# Patient Record
Sex: Female | Born: 1965 | Race: Black or African American | Hispanic: No | Marital: Married | Smoking: Never smoker
Health system: Southern US, Community
[De-identification: ages and names within clinical notes are randomized; demographics above are authoritative.]

## PROBLEM LIST (undated history)

## (undated) DIAGNOSIS — R112 Nausea with vomiting, unspecified: Secondary | ICD-10-CM

## (undated) DIAGNOSIS — Z9889 Other specified postprocedural states: Secondary | ICD-10-CM

## (undated) DIAGNOSIS — T4145XA Adverse effect of unspecified anesthetic, initial encounter: Secondary | ICD-10-CM

## (undated) DIAGNOSIS — T8859XA Other complications of anesthesia, initial encounter: Secondary | ICD-10-CM

## (undated) DIAGNOSIS — D649 Anemia, unspecified: Secondary | ICD-10-CM

## (undated) HISTORY — PX: ABDOMINAL HYSTERECTOMY: SHX81

## (undated) HISTORY — PX: CHOLECYSTECTOMY: SHX55

---

## 2000-01-18 ENCOUNTER — Ambulatory Visit (HOSPITAL_COMMUNITY): Admission: RE | Admit: 2000-01-18 | Discharge: 2000-01-18 | Payer: Self-pay | Admitting: Family Medicine

## 2000-01-18 ENCOUNTER — Encounter: Payer: Self-pay | Admitting: Family Medicine

## 2000-09-03 ENCOUNTER — Encounter: Payer: Self-pay | Admitting: Family Medicine

## 2000-09-03 ENCOUNTER — Encounter: Admission: RE | Admit: 2000-09-03 | Discharge: 2000-09-03 | Payer: Self-pay | Admitting: Family Medicine

## 2001-05-21 ENCOUNTER — Encounter (INDEPENDENT_AMBULATORY_CARE_PROVIDER_SITE_OTHER): Payer: Self-pay | Admitting: *Deleted

## 2001-05-22 ENCOUNTER — Encounter: Payer: Self-pay | Admitting: Emergency Medicine

## 2001-05-22 ENCOUNTER — Inpatient Hospital Stay (HOSPITAL_COMMUNITY): Admission: EM | Admit: 2001-05-22 | Discharge: 2001-05-26 | Payer: Self-pay | Admitting: Emergency Medicine

## 2001-05-25 ENCOUNTER — Encounter: Payer: Self-pay | Admitting: Surgery

## 2002-09-20 ENCOUNTER — Encounter: Payer: Self-pay | Admitting: Obstetrics and Gynecology

## 2002-09-20 ENCOUNTER — Ambulatory Visit (HOSPITAL_COMMUNITY): Admission: RE | Admit: 2002-09-20 | Discharge: 2002-09-20 | Payer: Self-pay | Admitting: Obstetrics and Gynecology

## 2002-09-21 ENCOUNTER — Encounter (INDEPENDENT_AMBULATORY_CARE_PROVIDER_SITE_OTHER): Payer: Self-pay | Admitting: Specialist

## 2002-09-21 ENCOUNTER — Ambulatory Visit (HOSPITAL_COMMUNITY): Admission: RE | Admit: 2002-09-21 | Discharge: 2002-09-21 | Payer: Self-pay | Admitting: Obstetrics and Gynecology

## 2002-12-09 ENCOUNTER — Emergency Department (HOSPITAL_COMMUNITY): Admission: AD | Admit: 2002-12-09 | Discharge: 2002-12-10 | Payer: Self-pay

## 2002-12-10 ENCOUNTER — Encounter: Payer: Self-pay | Admitting: Emergency Medicine

## 2003-01-20 ENCOUNTER — Other Ambulatory Visit: Admission: RE | Admit: 2003-01-20 | Discharge: 2003-01-20 | Payer: Self-pay | Admitting: Pediatrics

## 2003-01-20 ENCOUNTER — Other Ambulatory Visit: Admission: RE | Admit: 2003-01-20 | Discharge: 2003-01-20 | Payer: Self-pay | Admitting: Obstetrics and Gynecology

## 2003-08-28 ENCOUNTER — Inpatient Hospital Stay (HOSPITAL_COMMUNITY): Admission: AD | Admit: 2003-08-28 | Discharge: 2003-08-30 | Payer: Self-pay | Admitting: Obstetrics & Gynecology

## 2004-03-26 ENCOUNTER — Other Ambulatory Visit: Admission: RE | Admit: 2004-03-26 | Discharge: 2004-03-26 | Payer: Self-pay | Admitting: Obstetrics and Gynecology

## 2005-06-03 ENCOUNTER — Other Ambulatory Visit: Admission: RE | Admit: 2005-06-03 | Discharge: 2005-06-03 | Payer: Self-pay | Admitting: Obstetrics and Gynecology

## 2005-07-08 ENCOUNTER — Ambulatory Visit (HOSPITAL_COMMUNITY): Admission: RE | Admit: 2005-07-08 | Discharge: 2005-07-08 | Payer: Self-pay | Admitting: Obstetrics and Gynecology

## 2005-07-08 ENCOUNTER — Encounter (INDEPENDENT_AMBULATORY_CARE_PROVIDER_SITE_OTHER): Payer: Self-pay | Admitting: Specialist

## 2006-04-21 ENCOUNTER — Inpatient Hospital Stay: Admission: AD | Admit: 2006-04-21 | Discharge: 2006-04-21 | Payer: Self-pay | Admitting: Obstetrics and Gynecology

## 2010-02-06 ENCOUNTER — Encounter: Admission: RE | Admit: 2010-02-06 | Discharge: 2010-02-06 | Payer: Self-pay | Admitting: Obstetrics and Gynecology

## 2010-02-21 ENCOUNTER — Ambulatory Visit (HOSPITAL_COMMUNITY): Admission: RE | Admit: 2010-02-21 | Discharge: 2010-02-21 | Payer: Self-pay | Admitting: Obstetrics and Gynecology

## 2010-05-12 ENCOUNTER — Encounter: Payer: Self-pay | Admitting: Obstetrics and Gynecology

## 2010-07-03 LAB — CBC
Hemoglobin: 12 g/dL (ref 12.0–15.0)
MCHC: 32.6 g/dL (ref 30.0–36.0)
RDW: 12.4 % (ref 11.5–15.5)
WBC: 6.6 10*3/uL (ref 4.0–10.5)

## 2010-07-03 LAB — PREGNANCY, URINE: Preg Test, Ur: NEGATIVE

## 2010-09-07 NOTE — Consult Note (Signed)
[place]  Patient:    Breanna Gates, Breanna Gates Visit Number: [id number] MRN: [id number]          Service Type: MED Location: 1800 1823 01 Attending Physician:  Nelia Shi Dictated by:   Sandria Bales. Ezzard Standing, M.D. Proc. Date: 05/22/01 Admit Date:  05/21/2001   CC:         Miguel Aschoff, M.D.             Anastasio Auerbach, M.D.                          Consultation Report  DATE OF BIRTH: 22-Mar-1966  REASON FOR CONSULTATION: Pancreatitis.  HISTORY OF PRESENT ILLNESS: Breanna Gates is a 45 year old black female, who is a patient of Dr. Miguel Aschoff of [facility], who has known cholelithiasis.  She had the gallstones diagnosed approximately a year ago, January 2002, when she was having some vague abdominal pain, particularly precipitated by eating red meat, and underwent an ultrasound.  Interestingly, we actually operated on her mother this past, I think, June 2002 for cholelithiasis.  Anyway, Breanna Gates was anxious to have surgery. She ate some spicy [place] food on Wednesday evening, May 20, 2001, and had increasing abdominal pain, and presented the evening of May 21, 2001 to the [facility] Emergency Room.  Evaluation revealed an elevated amylase to 5644, elevated lipase to 6653, and her diagnosis of acute pancreatitis probably secondary to cholelithiasis was made.  The patient denies any history of peptic ulcer disease, liver disease, prior pancreatic disease, or colon disease.  She has had no prior abdominal surgery.  ALLERGIES: None.  MEDICATIONS: Her only medication is birth control pills.  REVIEW OF SYSTEMS: PULMONARY: Does not smoke cigarettes.  No history of pneumonia, tuberculosis.  CARDIAC: No history of chest pain, heart attack, hypertension.  GASTROINTESTINAL: See History of Present Illness.  UROLOGIC: No history of kidney stones or kidney infections.  GYN: She has never been pregnant.   Her last period was about four weeks ago.  SOCIAL HISTORY: She works for [employer].  She is accompanied by her mother in the emergency room.  PHYSICAL EXAMINATION:  VITAL SIGNS: Temperature 98.8 degrees, blood pressure 145/74, pulse 95, respirations 18.  GENERAL: She is a well-nourished, pleasant black female.  She is alert and cooperative on physical examination.  HEENT: Unremarkable.  NECK: Supple without mass, without thyromegaly.  LUNGS: Clear to auscultation, with equal respirations.  HEART: Regular rate and rhythm.  ABDOMEN: Decreased bowel sounds, but she does have a few bowel sounds.  She has some mild epigastric tenderness which is not lateralized very well.  She has no hernia, no abdominal mass.  PELVIC: I did not do a pelvic examination on her.  EXTREMITIES: She has good strength in all four extremities.  LABORATORY DATA: Besides the amylase and lipase already recorded in my history, her WBC was 6600, her hemoglobin 12, hematocrit 37.  Urine pregnancy was negative.  Liver function tests are pending at the time of this dictation.  IMPRESSION:  1. Acute pancreatitis with elevated lipase and amylase.  CT scan only     suggests mild pancreatitis with questionable common bile duct dilatation.     I agree with current plan of intravenous hydration, keeping her NPO,     following her amylase and lipase and liver functions on a daily basis.  2. Cholelithiasis.  She has had a CT  scan.  They raised the question of an     emphysematous gallbladder, but her clinical picture really does not match     as she is not febrile, she has a normal WBC, she does not have     localizing right upper quadrant tenderness.  I actually think they are     reading possibly hypoechoic hypodense gallstones, which may be floating     in bile against the gallbladder wall.  RECOMMENDATIONS: I think she will need eventual cholecystectomy when her pancreatitis has cleared, either during this  hospitalization or soon after discharge.  I explained this to her and her mother. Dictated by:   Sandria Bales. Ezzard Standing, M.D. Attending Physician:  Nelia Shi DD:  05/22/01 TD:  05/22/01 Job: [id number]

## 2010-09-07 NOTE — Op Note (Signed)
Breanna Gates, Breanna Gates NO.:  [id number]   MEDICAL RECORD NO.:  [id number]          PATIENT TYPE:  AMB   LOCATION:  SDC                           FACILITY:  [facility]   PHYSICIAN:  Carrington Clamp, M.D. DATE OF BIRTH:  1965/10/27   DATE OF PROCEDURE:  07/08/2005  DATE OF DISCHARGE:                                 OPERATIVE REPORT   PREOPERATIVE DIAGNOSIS:  Missed abortion.   POSTOPERATIVE DIAGNOSIS:  Missed abortion.   PROCEDURE:  Dilation and evacuation.   SURGEON:  Carrington Clamp, M.D.   ASSISTANT:  None.   ANESTHESIA:  General.   SPECIMENS:  Uterine contents.   ESTIMATED BLOOD LOSS:  150 mL.   IV FLUIDS:  500 mL.   URINE OUTPUT:  Not measured.   COMPLICATIONS:  None.   FINDINGS:  Nine weeks' size uterus down to seven weeks size post procedure.  Good cry.   MEDICATIONS:  None.   COUNTS:  Correct x3.   MEDICATIONS:  Methergine 0.2 mg IM.   TECHNIQUE:  After adequate LMA anesthesia was achieved, the patient was  prepped and draped in the usual sterile fashion in the dorsal lithotomy  position.  The bladder was emptied with a red rubber catheter and a speculum  placed in the vagina.  A single-tooth tenaculum was used to stabilize the  cervix and the cervix was dilated up with Hegar dilators.  The 9 mm curette  was passed into the uterus and suction curettage was performed several  times.  Alternating suction curettage and sharp curettage were performed until a  good cry was noted.  All instruments were then withdrawn from the vagina and  the patient was given a dose of Methergine, returned to recovery room in  stable condition.      Carrington Clamp, M.D.  Electronically Signed     MH/MEDQ  D:  07/08/2005  T:  07/09/2005  Job:  [id number]

## 2010-09-07 NOTE — Op Note (Signed)
[place]. [facility]  Patient:    Breanna Gates, Breanna Gates Visit Number: [id number] MRN: [id number]          Service Type: MED Location: 5000 5020 01 Attending Physician:  Lonia Blood Dictated by:   Sandria Bales. Ezzard Standing, M.D. Proc. Date: 05/25/01 Admit Date:  05/21/2001 Discharge Date: 05/26/2001   CC:         Dr. Allen Derry, M.D.                           Operative Report  DATE OF BIRTH:  [birth year].  PREOPERATIVE DIAGNOSIS:  Chronic cholecystitis, cholelithiasis, possible common bile duct stone, with gallstone pancreatitis.  POSTOPERATIVE DIAGNOSIS:  Chronic cholecystitis, cholelithiasis, and gallstone pancreatitis, with normal intraoperative cholangiogram.  PROCEDURE:  Laparoscopic cholecystectomy with intraoperative cholangiogram.  SURGEON:  Sandria Bales. Ezzard Standing, M.D.  FIRST ASSISTANT:  Maisie Fus B. Samuella Cota, M.D.  ANESTHESIA:  General endotracheal.  ESTIMATED BLOOD LOSS:  Minimal.  INDICATION FOR PROCEDURE:  Breanna Gates is a 45 year old black female who presented to [facility] on January 31 with acute pancreatitis secondary to known cholelithiasis.  The patient now has had almost complete resolution of her liver function tests, comes for cholecystectomy.  DESCRIPTION OF PROCEDURE:  The patient was already on Unasyn as an antibiotic. She had an orogastric tube, PAS stockings in place.  Her abdomen was prepped with Betadine solution and sterilely draped.  An infraumbilical was made with sharp dissection and carried down to the abdominal cavity.  A 0 degree 10 mm laparoscope was inserted through an infraumbilical incision, and this was inserted through a 12 mm Hasson trocar. The Hasson trocar was inserted and secured with a 0 Vicryl suture.  Abdominal exploration revealed the right and left lobes of the liver were unremarkable.  There was some edema along the falciform ligament consistent with a probable  resolving pancreatitis.  Her stomach was unremarkable.  Her colon was mildly distended.  The remainder of the exam, including her bowel and her pelvic area, all was unremarkable from what could be seen.  Three additional trocars were placed, a 10 mm Ethicon trocar in the subxiphoid location and a 5 mm Ethicon trocar in the right midsubcostal location and a 5 mm Ethicon trocar in the right lateral subcostal location.  First I had to take down some adhesions which overlay the gallbladder, and these really looked like she had had some chronic cholecystitis.  I got to the gallbladder, which had a thickened wall, again consistent with chronic cholecystitis, and dissected down to the gallbladder-cystic duct junction.  I then placed a clip on the gallbladder side of the cystic duct, shot an intraoperative cholangiogram.  Intraoperative cholangiogram was done using a cut-off Taut catheter inserted through a 14-gauge Jelco.  The Taut catheter was inserted to the side of the cystic duct and intraoperative cholangiogram obtained.  Intraoperative cholangiogram using half-strength Hypaque solution injected under direct fluoroscopy showed free flow of contrast down the cystic duct to the common bile duct into the duodenum.  It rapidly emptied.  There was no filling defect, no evidence of mass, and this was felt to be a normal intraoperative cholangiogram.  The Taut catheter was then removed, the cystic duct triply endoclipped.  There was a branch of the cystic artery which was identified, and this was doubly endoclipped.  The gallbladder was then sharply and  bluntly dissected from the gallbladder bed.  I did avulse the lining of the liver at the to remove the wall of the gallbladder.  This was because the gallbladder was so thickened, it was attached this way, and this caused some bleeding which took me a little while to get under control with Bovie electrocautery.  However, after  irrigating cautery, there was no bleeding, no bile leak.  I did put some Surgicel in the gallbladder bed.  I then removed the gallbladder, placed it in an Endocatch bag, and delivered it through the umbilicus and sent it to pathology.  The abdomen was irrigated with approximately 2 L of saline.  Each trocar was removed under direct visualization.  There was no bleeding at any trocar site. The umbilical trocar was closed with a 0 Vicryl suture, and the skin of each site was closed with a 5-0 Vicryl suture, painted with tincture of benzoin and Steri-Strips, and sterilely dressed.  The patient tolerated the procedure well, was transported to the recovery room in good condition.  The sponge and needle count were correct at the end of the case. Dictated by:   Sandria Bales. Ezzard Standing, M.D. Attending Physician:  Jetty Duhamel T DD:  05/25/01 TD:  05/25/01 Job: [id number]

## 2010-09-07 NOTE — Op Note (Signed)
   NAMEKEIERA, STRATHMAN NO.:  [id number]   MEDICAL RECORD NO.:  [id number]                   PATIENT TYPE:  AMB   LOCATION:  SDC                                  FACILITY:  [facility]   PHYSICIAN:  Malva Limes, M.D.                 DATE OF BIRTH:  [birth year]   DATE OF PROCEDURE:  DATE OF DISCHARGE:                                 OPERATIVE REPORT   PREOPERATIVE DIAGNOSES:  Missed abortion.   POSTOPERATIVE DIAGNOSES:  Missed abortion.   PROCEDURE:  Dilation and curettage.   SURGEON:  Malva Limes, M.D.   ANESTHESIA:  MAC with paracervical block.   ANTIBIOTICS:  1. Ancef 1 g.  2. Cleocin 900 mg IV x1.   ESTIMATED BLOOD LOSS:  30 mL.   COMPLICATIONS:  None.   SPECIMENS:  Products of conception sent to pathology.   DRAINS:  None.   PROCEDURE:  The patient was taken to the operating room where she was placed  in a dorsal lithotomy position.  She was prepped with Hibiclens and draped  in the usual fashion for this procedure.  A sterile speculum was placed in  the vagina.  20 mL of 1% lidocaine was used for paracervical block.  A  single tooth tenaculum was applied to the anterior lip of the cervix.  The  cervical os was then serially dilated to a 31 [place].  An 8 mm suction  cannula was placed into the uterine cavity and products of conception were  withdrawn.  Sharp curettage was then performed followed by repeat suction.  The patient tolerated the procedure well.  She will be discharged to home.  She will be sent home with Keflex 500 mg q.i.d. for two days and Darvocet to  take p.r.n.  The patient's blood type is Rh positive and therefore no RhoGAM  is indicated.  She will follow up in the office in four weeks.                                               Malva Limes, M.D.    MA/MEDQ  D:  09/21/2002  T:  09/21/2002  Job:  [id number]

## 2011-02-15 ENCOUNTER — Other Ambulatory Visit (HOSPITAL_COMMUNITY): Payer: Self-pay | Admitting: Obstetrics and Gynecology

## 2011-02-15 DIAGNOSIS — N971 Female infertility of tubal origin: Secondary | ICD-10-CM

## 2011-03-04 ENCOUNTER — Ambulatory Visit (HOSPITAL_COMMUNITY): Payer: Self-pay

## 2011-03-05 ENCOUNTER — Ambulatory Visit (HOSPITAL_COMMUNITY)
Admission: RE | Admit: 2011-03-05 | Discharge: 2011-03-05 | Disposition: A | Discharge status: Home / Self Care | Payer: BC Managed Care – PPO | Source: Ambulatory Visit | Attending: Obstetrics and Gynecology | Admitting: Obstetrics and Gynecology

## 2011-03-05 DIAGNOSIS — N971 Female infertility of tubal origin: Secondary | ICD-10-CM

## 2011-03-05 DIAGNOSIS — Z3049 Encounter for surveillance of other contraceptives: Secondary | ICD-10-CM | POA: Insufficient documentation

## 2011-03-05 MED ORDER — IOHEXOL 300 MG/ML  SOLN: 3.0000 mL | Freq: Once | INTRAMUSCULAR | Status: AC | PRN

## 2014-06-09 ENCOUNTER — Other Ambulatory Visit (HOSPITAL_COMMUNITY): Payer: Self-pay | Admitting: Obstetrics and Gynecology

## 2014-06-09 ENCOUNTER — Other Ambulatory Visit: Payer: Self-pay | Admitting: Obstetrics and Gynecology

## 2014-06-09 DIAGNOSIS — M954 Acquired deformity of chest and rib: Secondary | ICD-10-CM

## 2014-06-10 LAB — CYTOLOGY - PAP

## 2017-03-01 ENCOUNTER — Emergency Department (HOSPITAL_BASED_OUTPATIENT_CLINIC_OR_DEPARTMENT_OTHER): Payer: BLUE CROSS/BLUE SHIELD

## 2017-03-01 ENCOUNTER — Other Ambulatory Visit: Payer: Self-pay

## 2017-03-01 ENCOUNTER — Emergency Department (HOSPITAL_BASED_OUTPATIENT_CLINIC_OR_DEPARTMENT_OTHER)
Admission: EM | Admit: 2017-03-01 | Discharge: 2017-03-02 | Disposition: A | Discharge status: Home / Self Care | Payer: BLUE CROSS/BLUE SHIELD | Attending: Emergency Medicine | Admitting: Emergency Medicine

## 2017-03-01 ENCOUNTER — Encounter (HOSPITAL_BASED_OUTPATIENT_CLINIC_OR_DEPARTMENT_OTHER): Payer: Self-pay | Admitting: Emergency Medicine

## 2017-03-01 DIAGNOSIS — R1031 Right lower quadrant pain: Secondary | ICD-10-CM | POA: Insufficient documentation

## 2017-03-01 DIAGNOSIS — Z9049 Acquired absence of other specified parts of digestive tract: Secondary | ICD-10-CM | POA: Diagnosis not present

## 2017-03-01 LAB — CBC WITH DIFFERENTIAL/PLATELET
BASOS ABS: 0 10*3/uL (ref 0.0–0.1)
Basophils Relative: 0 %
EOS PCT: 1 %
Eosinophils Absolute: 0.1 10*3/uL (ref 0.0–0.7)
HEMATOCRIT: 39.6 % (ref 36.0–46.0)
Hemoglobin: 13 g/dL (ref 12.0–15.0)
LYMPHS PCT: 40 %
Lymphs Abs: 2.6 10*3/uL (ref 0.7–4.0)
MCH: 28.8 pg (ref 26.0–34.0)
MCHC: 32.8 g/dL (ref 30.0–36.0)
MCV: 87.6 fL (ref 78.0–100.0)
MONO ABS: 0.6 10*3/uL (ref 0.1–1.0)
MONOS PCT: 9 %
NEUTROS ABS: 3.2 10*3/uL (ref 1.7–7.7)
Neutrophils Relative %: 50 %
PLATELETS: 291 10*3/uL (ref 150–400)
RBC: 4.52 MIL/uL (ref 3.87–5.11)
RDW: 13.2 % (ref 11.5–15.5)
WBC: 6.4 10*3/uL (ref 4.0–10.5)

## 2017-03-01 LAB — COMPREHENSIVE METABOLIC PANEL
ALBUMIN: 4.4 g/dL (ref 3.5–5.0)
ALK PHOS: 79 U/L (ref 38–126)
ALT: 12 U/L — ABNORMAL LOW (ref 14–54)
ANION GAP: 6 (ref 5–15)
AST: 37 U/L (ref 15–41)
BILIRUBIN TOTAL: 0.7 mg/dL (ref 0.3–1.2)
BUN: 12 mg/dL (ref 6–20)
CO2: 28 mmol/L (ref 22–32)
Calcium: 9.1 mg/dL (ref 8.9–10.3)
Chloride: 104 mmol/L (ref 101–111)
Creatinine, Ser: 0.63 mg/dL (ref 0.44–1.00)
GFR calc Af Amer: >60 mL/min (ref >60)
GFR calc non Af Amer: >60 mL/min (ref >60)
GLUCOSE: 103 mg/dL — AB (ref 65–99)
POTASSIUM: 3.5 mmol/L (ref 3.5–5.1)
Sodium: 138 mmol/L (ref 135–145)
TOTAL PROTEIN: 7.2 g/dL (ref 6.5–8.1)

## 2017-03-01 LAB — URINALYSIS, MICROSCOPIC (REFLEX)

## 2017-03-01 LAB — URINALYSIS, ROUTINE W REFLEX MICROSCOPIC
Bilirubin Urine: NEGATIVE
GLUCOSE, UA: NEGATIVE mg/dL
Ketones, ur: 15 mg/dL — AB
LEUKOCYTES UA: NEGATIVE
Nitrite: NEGATIVE
PH: 6 (ref 5.0–8.0)
PROTEIN: NEGATIVE mg/dL
Specific Gravity, Urine: >1.03 — ABNORMAL HIGH (ref 1.005–1.030)

## 2017-03-01 LAB — PREGNANCY, URINE: PREG TEST UR: NEGATIVE

## 2017-03-01 LAB — LIPASE, BLOOD: Lipase: 27 U/L (ref 11–51)

## 2017-03-01 MED ORDER — SODIUM CHLORIDE 0.9 % IV BOLUS (SEPSIS)
1000.0000 mL | Freq: Once | INTRAVENOUS | Status: AC
  Administered 2017-03-01: 1000 mL via INTRAVENOUS

## 2017-03-01 MED ORDER — IOPAMIDOL (ISOVUE-300) INJECTION 61%
100.0000 mL | Freq: Once | INTRAVENOUS | Status: AC | PRN
  Administered 2017-03-01: 100 mL via INTRAVENOUS

## 2017-03-01 MED ORDER — MORPHINE SULFATE (PF) 4 MG/ML IV SOLN
4.0000 mg | Freq: Once | INTRAVENOUS | Status: AC
  Administered 2017-03-01: 4 mg via INTRAVENOUS
  Filled 2017-03-01: qty 1

## 2017-03-01 NOTE — ED Provider Notes (Signed)
[facility] DEPARTMENT Provider Note   CSN: [id number] Arrival date & time: 03/01/17  2142     History   Chief Complaint Chief Complaint  Patient presents with  . Abdominal Pain    HPI Breanna Gates is a 51 y.o. female.  51yo F w/ PMH includingcholecystectomy who presents with abdominal pain.  Last night, she had a gradual onset of right lower quadrant abdominal pain that has been sharp, intermittent, and persistent since it began.  She denies any radiation of the pain to her back or anywhere else in her abdomen.  She has had no associated nausea, vomiting, diarrhea, fevers, urinary symptoms, or recent illness.  She has taken Aleve for the pain that she states takes several hours before it kicks in and eventually wears off and the pain returns.  She has never had pain like this before.   The history is provided by the patient.  Abdominal Pain      History reviewed. No pertinent past medical history.  There are no active problems to display for this patient.   Past Surgical History:  Procedure Laterality Date  . CHOLECYSTECTOMY      OB History    No data available       Home Medications    Prior to Admission medications   Not on File    Family History No family history on file.  Social History Social History   Tobacco Use  . Smoking status: Never Smoker  . Smokeless tobacco: Never Used  Substance Use Topics  . Alcohol use: No    Frequency: Never  . Drug use: No     Allergies   Patient has no known allergies.   Review of Systems Review of Systems  Gastrointestinal: Positive for abdominal pain.   All other systems reviewed and are negative except that which was mentioned in HPI   Physical Exam Updated Vital Signs BP (!) 141/95 (BP Location: Left Arm)   Pulse 85   Temp 98.6 F (37 C) (Oral)   Resp (!) 21   LMP 01/20/2017 (Approximate)   SpO2 100%   Physical Exam  Constitutional: She is oriented to person, place,  and time. She appears well-developed and well-nourished.  uncomfortable  HENT:  Head: Normocephalic and atraumatic.  Mouth/Throat: Oropharynx is clear and moist.  Moist mucous membranes  Eyes: Conjunctivae are normal. Pupils are equal, round, and reactive to light.  Neck: Neck supple.  Cardiovascular: Normal rate, regular rhythm and normal heart sounds.  No murmur heard. Pulmonary/Chest: Effort normal and breath sounds normal.  Abdominal: Soft. Bowel sounds are normal. She exhibits no distension. There is tenderness in the right lower quadrant. There is no rigidity, no rebound and no guarding.  Musculoskeletal: She exhibits no edema.  Neurological: She is alert and oriented to person, place, and time.  Fluent speech  Skin: Skin is warm and dry.  Psychiatric: She has a normal mood and affect. Judgment normal.  Nursing note and vitals reviewed.    ED Treatments / Results  Labs (all labs ordered are listed, but only abnormal results are displayed) Labs Reviewed  COMPREHENSIVE METABOLIC PANEL - Abnormal; Notable for the following components:      Result Value   Glucose, Bld 103 (*)    ALT 12 (*)    All other components within normal limits  CBC WITH DIFFERENTIAL/PLATELET  LIPASE, BLOOD  PREGNANCY, URINE  URINALYSIS, ROUTINE W REFLEX MICROSCOPIC    EKG  EKG Interpretation None  Radiology No results found.  Procedures Procedures (including critical care time)  Medications Ordered in ED Medications  morphine 4 MG/ML injection 4 mg (4 mg Intravenous Given 03/01/17 2215)  sodium chloride 0.9 % bolus 1,000 mL (1,000 mLs Intravenous New Bag/Given 03/01/17 2219)     Initial Impression / Assessment and Plan / ED Course  I have reviewed the triage vital signs and the nursing notes.  Pertinent labs & imaging results that were available during my care of the patient were reviewed by me and considered in my medical decision making (see chart for details).    RLQ pain  x 1 day, non-toxic on exam w/ reassuring VS. Tenderness in RLQ, no peritonitis. Gave morphine and IVF bolus. DDx includes appendicitis, kidney stone, ovarian cyst, ovarian torsion, ectopic pregnancy.   Labs show unremarkable CMP and CBC.  Urine pregnancy test and urinalysis are pending.  Will order CT after urine studies are completed.  I have signed out to the oncoming provider pending urine studies and CT abd/pelvis.  Final Clinical Impressions(s) / ED Diagnoses   Final diagnoses:  None    ED Discharge Orders    None       Kalyn Dimattia, Wenda Overland, MD 03/01/17 2324

## 2017-03-01 NOTE — ED Triage Notes (Signed)
PT presents with c/o RLQ pain that started last night.

## 2017-03-01 NOTE — ED Provider Notes (Signed)
Nursing notes and vitals signs, including pulse oximetry, reviewed.  Summary of this visit's results, reviewed by myself:  EKG:  EKG Interpretation  Date/Time:    Ventricular Rate:    PR Interval:    QRS Duration:   QT Interval:    QTC Calculation:   R Axis:     Text Interpretation:         Labs:  Results for orders placed or performed during the hospital encounter of 03/01/17 (from the past 24 hour(s))  Comprehensive metabolic panel     Status: Abnormal   Collection Time: 03/01/17 10:11 PM  Result Value Ref Range   Sodium 138 135 - 145 mmol/L   Potassium 3.5 3.5 - 5.1 mmol/L   Chloride 104 101 - 111 mmol/L   CO2 28 22 - 32 mmol/L   Glucose, Bld 103 (H) 65 - 99 mg/dL   BUN 12 6 - 20 mg/dL   Creatinine, Ser 0.63 0.44 - 1.00 mg/dL   Calcium 9.1 8.9 - 10.3 mg/dL   Total Protein 7.2 6.5 - 8.1 g/dL   Albumin 4.4 3.5 - 5.0 g/dL   AST 37 15 - 41 U/L   ALT 12 (L) 14 - 54 U/L   Alkaline Phosphatase 79 38 - 126 U/L   Total Bilirubin 0.7 0.3 - 1.2 mg/dL   GFR calc non Af Amer >60 >60 mL/min   GFR calc Af Amer >60 >60 mL/min   Anion gap 6 5 - 15  CBC with Differential     Status: None   Collection Time: 03/01/17 10:11 PM  Result Value Ref Range   WBC 6.4 4.0 - 10.5 K/uL   RBC 4.52 3.87 - 5.11 MIL/uL   Hemoglobin 13.0 12.0 - 15.0 g/dL   HCT 39.6 36.0 - 46.0 %   MCV 87.6 78.0 - 100.0 fL   MCH 28.8 26.0 - 34.0 pg   MCHC 32.8 30.0 - 36.0 g/dL   RDW 13.2 11.5 - 15.5 %   Platelets 291 150 - 400 K/uL   Neutrophils Relative % 50 %   Neutro Abs 3.2 1.7 - 7.7 K/uL   Lymphocytes Relative 40 %   Lymphs Abs 2.6 0.7 - 4.0 K/uL   Monocytes Relative 9 %   Monocytes Absolute 0.6 0.1 - 1.0 K/uL   Eosinophils Relative 1 %   Eosinophils Absolute 0.1 0.0 - 0.7 K/uL   Basophils Relative 0 %   Basophils Absolute 0.0 0.0 - 0.1 K/uL  Lipase, blood     Status: None   Collection Time: 03/01/17 10:11 PM  Result Value Ref Range   Lipase 27 11 - 51 U/L  Pregnancy, urine     Status: None    Collection Time: 03/01/17 11:10 PM  Result Value Ref Range   Preg Test, Ur NEGATIVE NEGATIVE  Urinalysis, Routine w reflex microscopic     Status: Abnormal   Collection Time: 03/01/17 11:10 PM  Result Value Ref Range   Color, Urine YELLOW YELLOW   APPearance CLEAR CLEAR   Specific Gravity, Urine >1.030 (H) 1.005 - 1.030   pH 6.0 5.0 - 8.0   Glucose, UA NEGATIVE NEGATIVE mg/dL   Hgb urine dipstick TRACE (A) NEGATIVE   Bilirubin Urine NEGATIVE NEGATIVE   Ketones, ur 15 (A) NEGATIVE mg/dL   Protein, ur NEGATIVE NEGATIVE mg/dL   Nitrite NEGATIVE NEGATIVE   Leukocytes, UA NEGATIVE NEGATIVE  Urinalysis, Microscopic (reflex)     Status: Abnormal   Collection Time: 03/01/17 11:10 PM  Result  Value Ref Range   RBC / HPF 0-5 0 - 5 RBC/hpf   WBC, UA 0-5 0 - 5 WBC/hpf   Bacteria, UA FEW (A) NONE SEEN   Squamous Epithelial / LPF 0-5 (A) NONE SEEN   Mucus PRESENT     Imaging Studies: Ct Abdomen Pelvis W Contrast  Result Date: 03/02/2017 CLINICAL DATA:  Acute onset of right lower quadrant abdominal pain. EXAM: CT ABDOMEN AND PELVIS WITH CONTRAST TECHNIQUE: Multidetector CT imaging of the abdomen and pelvis was performed using the standard protocol following bolus administration of intravenous contrast. CONTRAST:  19mL ISOVUE-300 IOPAMIDOL (ISOVUE-300) INJECTION 61% COMPARISON:  None. FINDINGS: Lower chest: The visualized lung bases are grossly clear. The visualized portions of the mediastinum are unremarkable. Hepatobiliary: A nonspecific 1.9 cm hypodensity is noted at the right hepatic lobe. The liver is otherwise unremarkable. The patient is status post cholecystectomy, with clips noted at the gallbladder fossa. Mild prominence of the intrahepatic biliary ducts is likely within normal limits status post cholecystectomy. Pancreas: The pancreas is within normal limits. Spleen: The spleen is unremarkable in appearance. Adrenals/Urinary Tract: The adrenal glands are unremarkable in appearance. The  kidneys are within normal limits. There is no evidence of hydronephrosis. No renal or ureteral stones are identified. No perinephric stranding is seen. Stomach/Bowel: The stomach is unremarkable in appearance. The small bowel is within normal limits. The appendix is normal in caliber, without evidence of appendicitis. The colon is unremarkable in appearance. Vascular/Lymphatic: The abdominal aorta is unremarkable in appearance. The inferior vena cava is grossly unremarkable. No retroperitoneal lymphadenopathy is seen. No pelvic sidewall lymphadenopathy is identified. Reproductive: Apparent loculated fluid is noted within the endometrial echo complex. This is of uncertain significance. Pelvic ultrasound would be helpful for further evaluation. The ovaries are relatively symmetric. No suspicious adnexal masses are seen. Other: No additional soft tissue abnormalities are seen. Musculoskeletal: No acute osseous abnormalities are identified. The visualized musculature is unremarkable in appearance. IMPRESSION: 1. Apparent loculated fluid within the endometrial echo complex, of uncertain significance. Pelvic ultrasound would be helpful for further evaluation, to exclude underlying mass. 2. Nonspecific 1.9 cm hypodensity at the right hepatic lobe. Electronically Signed   By: Garald Balding M.D.   On: 03/02/2017 00:10   12:18 AM Patient advised of CT findings.  We will have her return later today for pelvic ultrasound.   Verona Hartshorn, Jenny Reichmann, MD 03/02/17 [phone]

## 2017-03-02 ENCOUNTER — Encounter (HOSPITAL_BASED_OUTPATIENT_CLINIC_OR_DEPARTMENT_OTHER): Payer: Self-pay | Admitting: Radiology

## 2017-03-02 ENCOUNTER — Ambulatory Visit (HOSPITAL_BASED_OUTPATIENT_CLINIC_OR_DEPARTMENT_OTHER)
Admission: RE | Admit: 2017-03-02 | Discharge: 2017-03-02 | Disposition: A | Discharge status: Home / Self Care | Payer: BLUE CROSS/BLUE SHIELD | Source: Ambulatory Visit | Attending: Emergency Medicine | Admitting: Emergency Medicine

## 2017-03-02 DIAGNOSIS — N898 Other specified noninflammatory disorders of vagina: Secondary | ICD-10-CM | POA: Insufficient documentation

## 2017-03-02 DIAGNOSIS — R1031 Right lower quadrant pain: Secondary | ICD-10-CM

## 2017-03-02 NOTE — ED Notes (Signed)
Pt given d/c instructions as per chart. Verbalizes understanding. No questions. 

## 2017-06-02 ENCOUNTER — Other Ambulatory Visit: Payer: Self-pay | Admitting: Obstetrics and Gynecology

## 2017-06-02 DIAGNOSIS — R928 Other abnormal and inconclusive findings on diagnostic imaging of breast: Secondary | ICD-10-CM

## 2017-06-04 ENCOUNTER — Ambulatory Visit
Admission: RE | Admit: 2017-06-04 | Discharge: 2017-06-04 | Disposition: A | Discharge status: Home / Self Care | Payer: Managed Care, Other (non HMO) | Source: Ambulatory Visit | Attending: Obstetrics and Gynecology | Admitting: Obstetrics and Gynecology

## 2017-06-04 ENCOUNTER — Ambulatory Visit: Payer: BLUE CROSS/BLUE SHIELD

## 2017-06-04 DIAGNOSIS — R928 Other abnormal and inconclusive findings on diagnostic imaging of breast: Secondary | ICD-10-CM

## 2017-12-31 NOTE — Progress Notes (Signed)
Please place orders in epic! Pt. Has a preop

## 2017-12-31 NOTE — Patient Instructions (Signed)
DEREON WILLIAMSEN  12/31/2017   Your procedure is scheduled on: 01-14-18  Report to [facility] Main  Entrance  Report to admitting at     [place] AM    Call this number if you have problems the morning of surgery [phone]   Remember: Do not eat food or drink liquids :After Midnight.     Take these medicines the morning of surgery with A SIP OF WATER: none                                You may not have any metal on your body including hair pins and              piercings  Do not wear jewelry, make-up, lotions, powders or perfumes, deodorant             Do not wear nail polish.  Do not shave  48 hours prior to surgery.     Do not bring valuables to the hospital. [place].  Contacts, dentures or bridgework may not be worn into surgery.  Leave suitcase in the car. After surgery it may be brought to your room.                Please read over the following fact sheets you were given: _____________________________________________________________________           [facility] - Preparing for Surgery Before surgery, you can play an important role.  Because skin is not sterile, your skin needs to be as free of germs as possible.  You can reduce the number of germs on your skin by washing with CHG (chlorahexidine gluconate) soap before surgery.  CHG is an antiseptic cleaner which kills germs and bonds with the skin to continue killing germs even after washing. Please DO NOT use if you have an allergy to CHG or antibacterial soaps.  If your skin becomes reddened/irritated stop using the CHG and inform your nurse when you arrive at Short Stay. Do not shave (including legs and underarms) for at least 48 hours prior to the first CHG shower.  You may shave your face/neck. Please follow these instructions carefully:  1.  Shower with CHG Soap the night before surgery and the  morning of Surgery.  2.  If you choose to wash  your hair, wash your hair first as usual with your  normal  shampoo.  3.  After you shampoo, rinse your hair and body thoroughly to remove the  shampoo.                           4.  Use CHG as you would any other liquid soap.  You can apply chg directly  to the skin and wash                       Gently with a scrungie or clean washcloth.  5.  Apply the CHG Soap to your body ONLY FROM THE NECK DOWN.   Do not use on face/ open  Wound or open sores. Avoid contact with eyes, ears mouth and genitals (private parts).                       Wash face,  Genitals (private parts) with your normal soap.             6.  Wash thoroughly, paying special attention to the area where your surgery  will be performed.  7.  Thoroughly rinse your body with warm water from the neck down.  8.  DO NOT shower/wash with your normal soap after using and rinsing off  the CHG Soap.                9.  Pat yourself dry with a clean towel.            10.  Wear clean pajamas.            11.  Place clean sheets on your bed the night of your first shower and do not  sleep with pets. Day of Surgery : Do not apply any lotions/deodorants the morning of surgery.  Please wear clean clothes to the hospital/surgery center.  FAILURE TO FOLLOW THESE INSTRUCTIONS MAY RESULT IN THE CANCELLATION OF YOUR SURGERY PATIENT SIGNATURE_________________________________  NURSE SIGNATURE__________________________________  ________________________________________________________________________

## 2018-01-06 ENCOUNTER — Other Ambulatory Visit: Payer: Self-pay | Admitting: Obstetrics and Gynecology

## 2018-01-06 ENCOUNTER — Encounter (HOSPITAL_COMMUNITY): Payer: Self-pay

## 2018-01-06 ENCOUNTER — Encounter (HOSPITAL_COMMUNITY)
Admission: RE | Admit: 2018-01-06 | Discharge: 2018-01-06 | Disposition: A | Discharge status: Home / Self Care | Payer: Managed Care, Other (non HMO) | Source: Ambulatory Visit | Attending: Obstetrics and Gynecology | Admitting: Obstetrics and Gynecology

## 2018-01-06 ENCOUNTER — Other Ambulatory Visit: Payer: Self-pay

## 2018-01-06 DIAGNOSIS — Z01812 Encounter for preprocedural laboratory examination: Secondary | ICD-10-CM | POA: Insufficient documentation

## 2018-01-06 HISTORY — DX: Anemia, unspecified: D64.9

## 2018-01-06 HISTORY — DX: Nausea with vomiting, unspecified: R11.2

## 2018-01-06 HISTORY — DX: Adverse effect of unspecified anesthetic, initial encounter: T41.45XA

## 2018-01-06 HISTORY — DX: Other specified postprocedural states: Z98.890

## 2018-01-06 HISTORY — DX: Other complications of anesthesia, initial encounter: T88.59XA

## 2018-01-06 LAB — BASIC METABOLIC PANEL
ANION GAP: 6 (ref 5–15)
BUN: 13 mg/dL (ref 6–20)
CO2: 32 mmol/L (ref 22–32)
Calcium: 9.6 mg/dL (ref 8.9–10.3)
Chloride: 105 mmol/L (ref 98–111)
Creatinine, Ser: 0.76 mg/dL (ref 0.44–1.00)
Glucose, Bld: 94 mg/dL (ref 70–99)
POTASSIUM: 4.4 mmol/L (ref 3.5–5.1)
SODIUM: 143 mmol/L (ref 135–145)

## 2018-01-06 LAB — HCG, SERUM, QUALITATIVE: Preg, Serum: NEGATIVE

## 2018-01-06 LAB — CBC
HEMATOCRIT: 41.3 % (ref 36.0–46.0)
HEMOGLOBIN: 13.3 g/dL (ref 12.0–15.0)
MCH: 28.5 pg (ref 26.0–34.0)
MCHC: 32.2 g/dL (ref 30.0–36.0)
MCV: 88.6 fL (ref 78.0–100.0)
Platelets: 284 10*3/uL (ref 150–400)
RBC: 4.66 MIL/uL (ref 3.87–5.11)
RDW: 13.3 % (ref 11.5–15.5)
WBC: 3.2 10*3/uL — ABNORMAL LOW (ref 4.0–10.5)

## 2018-01-13 ENCOUNTER — Other Ambulatory Visit: Payer: Self-pay | Admitting: Obstetrics and Gynecology

## 2018-01-13 NOTE — H&P (Signed)
52 y.o. G4P1 with persistent menorrhagia and adenomyosis, s/p novasure and adiana for this and failed.  Pt also has a lot of cramping with periods and likely has endometriosis. Pt desires definitive.   Past Medical History:  Diagnosis Date  . Anemia    borderline  . Complication of anesthesia   . PONV (postoperative nausea and vomiting)    Past Surgical History:  Procedure Laterality Date  . ABDOMINAL HYSTERECTOMY     Dr. Philis Pique 01-14-18   robotic  . CHOLECYSTECTOMY      Social History   Socioeconomic History  . Marital status: Married    Spouse name: Not on file  . Number of children: Not on file  . Years of education: Not on file  . Highest education level: Not on file  Occupational History  . Not on file  Social Needs  . Financial resource strain: Not on file  . Food insecurity:    Worry: Not on file    Inability: Not on file  . Transportation needs:    Medical: Not on file    Non-medical: Not on file  Tobacco Use  . Smoking status: Never Smoker  . Smokeless tobacco: Never Used  Substance and Sexual Activity  . Alcohol use: No    Frequency: Never  . Drug use: No  . Sexual activity: Yes  Lifestyle  . Physical activity:    Days per week: Not on file    Minutes per session: Not on file  . Stress: Not on file  Relationships  . Social connections:    Talks on phone: Not on file    Gets together: Not on file    Attends religious service: Not on file    Active member of club or organization: Not on file    Attends meetings of clubs or organizations: Not on file    Relationship status: Not on file  . Intimate partner violence:    Fear of current or ex partner: Not on file    Emotionally abused: Not on file    Physically abused: Not on file    Forced sexual activity: Not on file  Other Topics Concern  . Not on file  Social History Narrative  . Not on file    No current facility-administered medications on file prior to encounter.    Current Outpatient  Medications on File Prior to Encounter  Medication Sig Dispense Refill  . naproxen sodium (ALEVE) 220 MG tablet Take 220 mg by mouth daily as needed (pain).      Allergies  Allergen Reactions  . Latex     Internally causes irritation   . Other Nausea And Vomiting    general anesthesia    There were no vitals filed for this visit.  Lungs: clear to ascultation Cor:  RRR Abdomen:  soft, nontender, nondistended. Ex:  no cords, erythema Pelvic:  Vulva: no masses, no atrophy, no lesions  Vagina: no tenderness, no erythema, no abnormal vaginal discharge, no vesicle(s) or ulcers, no cystocele, no rectocele  Cervix: grossly normal, no discharge, no cervical motion tenderness, sample taken for a Pap smear  Uterus: normal size (7), normal shape, midline, no uterine prolapse, non-tender  Bladder/Urethra: normal meatus, no urethral discharge, no urethral mass, bladder non distended, Urethra well supported  Adnexa/Parametria: no parametrial tenderness, no parametrial mass, no adnexal tenderness, no ovarian mass   transvaginal- 9x6x5; cystic adenomyosis; normal ovaries   A:  Pt with cystic adenomyosis, failed novasure, pain with periods and menorrhagia.  Pt did ask about doing vaginal hysterectomy vs robotic TLH and I explained that I feel robotic is the better route for the following reasons: 1. If she has endometriosis, I will be able to excise or cauterize areas if in areas that are safe to do so. 2.  She had an Adiana intrauterine tubal- I do not know if adhesions are likely from the cautery done to the tubes. 3.  I highly recommend removing her tubes to reduce likelihood of ovarian cancer in future and I could not guarantee total removal via vaginal. 4.  She does not have a lot of decensus and vaginal may be more difficult.    Pt agrees to proceed as planned.   P: P: All risks, benefits and alternatives d/w patient and she desires to proceed.  Patient has undergone a modified diet, ERAS  protocol and will receive preop antibiotics and SCDs during the operation.   Pt to have extended recovery but will go home same day if eating, ambulating, voiding and pain control is good.  Quinterius Gaida A

## 2018-01-14 ENCOUNTER — Ambulatory Visit (HOSPITAL_COMMUNITY): Payer: Managed Care, Other (non HMO) | Admitting: Anesthesiology

## 2018-01-14 ENCOUNTER — Encounter (HOSPITAL_COMMUNITY)
Admission: RE | Disposition: A | Discharge status: Home / Self Care | Payer: Self-pay | Source: Other Acute Inpatient Hospital | Attending: Obstetrics and Gynecology

## 2018-01-14 ENCOUNTER — Encounter (HOSPITAL_COMMUNITY): Payer: Self-pay

## 2018-01-14 ENCOUNTER — Ambulatory Visit (HOSPITAL_COMMUNITY)
Admission: RE | Admit: 2018-01-14 | Discharge: 2018-01-14 | Disposition: A | Discharge status: Home / Self Care | Payer: Managed Care, Other (non HMO) | Source: Other Acute Inpatient Hospital | Attending: Obstetrics and Gynecology | Admitting: Obstetrics and Gynecology

## 2018-01-14 DIAGNOSIS — D259 Leiomyoma of uterus, unspecified: Secondary | ICD-10-CM | POA: Insufficient documentation

## 2018-01-14 DIAGNOSIS — N800 Endometriosis of uterus: Secondary | ICD-10-CM | POA: Diagnosis not present

## 2018-01-14 DIAGNOSIS — N838 Other noninflammatory disorders of ovary, fallopian tube and broad ligament: Secondary | ICD-10-CM | POA: Diagnosis not present

## 2018-01-14 DIAGNOSIS — N938 Other specified abnormal uterine and vaginal bleeding: Secondary | ICD-10-CM | POA: Insufficient documentation

## 2018-01-14 DIAGNOSIS — N920 Excessive and frequent menstruation with regular cycle: Secondary | ICD-10-CM | POA: Insufficient documentation

## 2018-01-14 DIAGNOSIS — Z9049 Acquired absence of other specified parts of digestive tract: Secondary | ICD-10-CM | POA: Insufficient documentation

## 2018-01-14 DIAGNOSIS — Z9889 Other specified postprocedural states: Secondary | ICD-10-CM

## 2018-01-14 HISTORY — PX: CYSTOSCOPY: SHX5120

## 2018-01-14 HISTORY — PX: ROBOTIC ASSISTED BILATERAL SALPINGO OOPHERECTOMY: SHX6078

## 2018-01-14 HISTORY — PX: ROBOTIC ASSISTED LAPAROSCOPIC HYSTERECTOMY AND SALPINGECTOMY: SHX6379

## 2018-01-14 SURGERY — XI ROBOTIC ASSISTED LAPAROSCOPIC HYSTERECTOMY AND SALPINGECTOMY
Anesthesia: General | Laterality: Bilateral

## 2018-01-14 MED ORDER — ACETAMINOPHEN 500 MG PO TABS
1000.0000 mg | ORAL_TABLET | ORAL | Status: AC
  Administered 2018-01-14: 1000 mg via ORAL
  Filled 2018-01-14: qty 2

## 2018-01-14 MED ORDER — CEFAZOLIN SODIUM-DEXTROSE 2-4 GM/100ML-% IV SOLN
2.0000 g | INTRAVENOUS | Status: AC
  Administered 2018-01-14: 2 g via INTRAVENOUS
  Filled 2018-01-14: qty 100

## 2018-01-14 MED ORDER — KETOROLAC TROMETHAMINE 30 MG/ML IJ SOLN
30.0000 mg | Freq: Four times a day (QID) | INTRAMUSCULAR | Status: DC
  Administered 2018-01-14: 30 mg via INTRAVENOUS

## 2018-01-14 MED ORDER — OXYCODONE-ACETAMINOPHEN 5-325 MG PO TABS
1.0000 | ORAL_TABLET | ORAL | Status: DC | PRN
  Administered 2018-01-14: 1 via ORAL

## 2018-01-14 MED ORDER — DEXAMETHASONE SODIUM PHOSPHATE 10 MG/ML IJ SOLN
INTRAMUSCULAR | Status: AC
  Filled 2018-01-14: qty 1

## 2018-01-14 MED ORDER — MENTHOL 3 MG MT LOZG
1.0000 | LOZENGE | OROMUCOSAL | Status: DC | PRN
  Filled 2018-01-14: qty 9

## 2018-01-14 MED ORDER — FENTANYL CITRATE (PF) 100 MCG/2ML IJ SOLN
INTRAMUSCULAR | Status: DC | PRN
  Administered 2018-01-14: 150 ug via INTRAVENOUS
  Administered 2018-01-14: 100 ug via INTRAVENOUS

## 2018-01-14 MED ORDER — PROPOFOL 10 MG/ML IV BOLUS
INTRAVENOUS | Status: DC | PRN
  Administered 2018-01-14: 150 mg via INTRAVENOUS

## 2018-01-14 MED ORDER — ONDANSETRON HCL 4 MG/2ML IJ SOLN: 4.0000 mg | Freq: Four times a day (QID) | INTRAMUSCULAR | Status: DC | PRN

## 2018-01-14 MED ORDER — ROCURONIUM BROMIDE 10 MG/ML (PF) SYRINGE
PREFILLED_SYRINGE | INTRAVENOUS | Status: AC
  Filled 2018-01-14: qty 10

## 2018-01-14 MED ORDER — DIPHENHYDRAMINE HCL 50 MG/ML IJ SOLN
INTRAMUSCULAR | Status: DC | PRN
  Administered 2018-01-14: 12.5 mg via INTRAVENOUS

## 2018-01-14 MED ORDER — ONDANSETRON HCL 4 MG/2ML IJ SOLN
INTRAMUSCULAR | Status: DC | PRN
  Administered 2018-01-14: 4 mg via INTRAVENOUS

## 2018-01-14 MED ORDER — PROMETHAZINE HCL 25 MG/ML IJ SOLN: 6.2500 mg | INTRAMUSCULAR | Status: DC | PRN

## 2018-01-14 MED ORDER — MIDAZOLAM HCL 2 MG/2ML IJ SOLN
INTRAMUSCULAR | Status: AC
  Filled 2018-01-14: qty 2

## 2018-01-14 MED ORDER — IBUPROFEN 200 MG PO TABS: 800.0000 mg | ORAL_TABLET | Freq: Three times a day (TID) | ORAL | Status: DC | PRN

## 2018-01-14 MED ORDER — DIPHENHYDRAMINE HCL 50 MG/ML IJ SOLN
INTRAMUSCULAR | Status: AC
  Filled 2018-01-14: qty 1

## 2018-01-14 MED ORDER — LIDOCAINE 2% (20 MG/ML) 5 ML SYRINGE
INTRAMUSCULAR | Status: AC
  Filled 2018-01-14: qty 5

## 2018-01-14 MED ORDER — ONDANSETRON HCL 4 MG/2ML IJ SOLN
INTRAMUSCULAR | Status: AC
  Filled 2018-01-14: qty 2

## 2018-01-14 MED ORDER — MIDAZOLAM HCL 2 MG/2ML IJ SOLN
INTRAMUSCULAR | Status: DC | PRN
  Administered 2018-01-14: 2 mg via INTRAVENOUS

## 2018-01-14 MED ORDER — DEXAMETHASONE SODIUM PHOSPHATE 10 MG/ML IJ SOLN
INTRAMUSCULAR | Status: DC | PRN
  Administered 2018-01-14: 10 mg via INTRAVENOUS

## 2018-01-14 MED ORDER — SOD CITRATE-CITRIC ACID 500-334 MG/5ML PO SOLN
30.0000 mL | ORAL | Status: AC
  Administered 2018-01-14: 30 mL via ORAL
  Filled 2018-01-14: qty 30

## 2018-01-14 MED ORDER — ROPIVACAINE HCL 5 MG/ML IJ SOLN
INTRAMUSCULAR | Status: AC
  Filled 2018-01-14: qty 30

## 2018-01-14 MED ORDER — OXYCODONE-ACETAMINOPHEN 5-325 MG PO TABS: 1.0000 | ORAL_TABLET | ORAL | 0 refills | Status: AC | PRN

## 2018-01-14 MED ORDER — ROCURONIUM BROMIDE 10 MG/ML (PF) SYRINGE
PREFILLED_SYRINGE | INTRAVENOUS | Status: DC | PRN
  Administered 2018-01-14: 50 mg via INTRAVENOUS
  Administered 2018-01-14: 20 mg via INTRAVENOUS

## 2018-01-14 MED ORDER — SCOPOLAMINE 1 MG/3DAYS TD PT72
1.0000 | MEDICATED_PATCH | TRANSDERMAL | Status: DC
  Administered 2018-01-14: 1.5 mg via TRANSDERMAL
  Filled 2018-01-14: qty 1

## 2018-01-14 MED ORDER — KETOROLAC TROMETHAMINE 30 MG/ML IJ SOLN
INTRAMUSCULAR | Status: AC
  Filled 2018-01-14: qty 1

## 2018-01-14 MED ORDER — GABAPENTIN 300 MG PO CAPS
300.0000 mg | ORAL_CAPSULE | ORAL | Status: AC
  Administered 2018-01-14: 300 mg via ORAL
  Filled 2018-01-14: qty 1

## 2018-01-14 MED ORDER — PROPOFOL 10 MG/ML IV BOLUS
INTRAVENOUS | Status: AC
  Filled 2018-01-14: qty 40

## 2018-01-14 MED ORDER — LIDOCAINE 2% (20 MG/ML) 5 ML SYRINGE
INTRAMUSCULAR | Status: DC | PRN
  Administered 2018-01-14: 100 mg via INTRAVENOUS

## 2018-01-14 MED ORDER — FENTANYL CITRATE (PF) 250 MCG/5ML IJ SOLN
INTRAMUSCULAR | Status: AC
  Filled 2018-01-14: qty 5

## 2018-01-14 MED ORDER — FENTANYL CITRATE (PF) 100 MCG/2ML IJ SOLN: 25.0000 ug | INTRAMUSCULAR | Status: DC | PRN

## 2018-01-14 MED ORDER — SODIUM CHLORIDE 0.9 % IV SOLN
INTRAVENOUS | Status: DC | PRN
  Administered 2018-01-14: 60 mL

## 2018-01-14 MED ORDER — OXYCODONE-ACETAMINOPHEN 5-325 MG PO TABS
ORAL_TABLET | ORAL | Status: AC
  Filled 2018-01-14: qty 1

## 2018-01-14 MED ORDER — STERILE WATER FOR IRRIGATION IR SOLN
Status: DC | PRN
  Administered 2018-01-14: 1000 mL via INTRAVESICAL

## 2018-01-14 MED ORDER — PROPOFOL 500 MG/50ML IV EMUL
INTRAVENOUS | Status: DC | PRN
  Administered 2018-01-14: 25 ug/kg/min via INTRAVENOUS

## 2018-01-14 MED ORDER — SUGAMMADEX SODIUM 500 MG/5ML IV SOLN
INTRAVENOUS | Status: AC
  Filled 2018-01-14: qty 5

## 2018-01-14 MED ORDER — LACTATED RINGERS IV SOLN
INTRAVENOUS | Status: DC
  Administered 2018-01-14 (×2): via INTRAVENOUS

## 2018-01-14 MED ORDER — CELECOXIB 200 MG PO CAPS
400.0000 mg | ORAL_CAPSULE | ORAL | Status: AC
  Administered 2018-01-14: 400 mg via ORAL
  Filled 2018-01-14: qty 2

## 2018-01-14 MED ORDER — SODIUM CHLORIDE 0.9 % IR SOLN
Status: DC | PRN
  Administered 2018-01-14: 3000 mL

## 2018-01-14 MED ORDER — SUGAMMADEX SODIUM 200 MG/2ML IV SOLN
INTRAVENOUS | Status: DC | PRN
  Administered 2018-01-14: 200 mg via INTRAVENOUS

## 2018-01-14 MED ORDER — DEXAMETHASONE SODIUM PHOSPHATE 4 MG/ML IJ SOLN: 4.0000 mg | INTRAMUSCULAR | Status: DC

## 2018-01-14 MED ORDER — ONDANSETRON HCL 4 MG PO TABS
4.0000 mg | ORAL_TABLET | Freq: Four times a day (QID) | ORAL | Status: DC | PRN
  Filled 2018-01-14: qty 1

## 2018-01-14 MED ORDER — PROPOFOL 10 MG/ML IV BOLUS
INTRAVENOUS | Status: AC
  Filled 2018-01-14: qty 20

## 2018-01-14 MED ORDER — SUGAMMADEX SODIUM 200 MG/2ML IV SOLN
INTRAVENOUS | Status: AC
  Filled 2018-01-14: qty 2

## 2018-01-14 MED ORDER — SODIUM CHLORIDE 0.9 % IJ SOLN
INTRAMUSCULAR | Status: AC
  Filled 2018-01-14: qty 50

## 2018-01-14 MED ORDER — ARTIFICIAL TEARS OPHTHALMIC OINT
TOPICAL_OINTMENT | OPHTHALMIC | Status: AC
  Filled 2018-01-14: qty 3.5

## 2018-01-14 MED FILL — OXYCODONE-ACETAMINOPHEN 5-3: 5-325 | 5 days supply | Qty: 30 | Fill #0

## 2018-01-14 SURGICAL SUPPLY — 56 items
ADH SKN CLS APL DERMABOND .7 (GAUZE/BANDAGES/DRESSINGS) ×1
BARRIER ADHS 3X4 INTERCEED (GAUZE/BANDAGES/DRESSINGS) IMPLANT
BRR ADH 4X3 ABS CNTRL BYND (GAUZE/BANDAGES/DRESSINGS)
CANISTER SUCT 3000ML PPV (MISCELLANEOUS) ×1 IMPLANT
CATH FOLEY 2WAY SLVR  5CC 16FR (CATHETERS)
CATH FOLEY 2WAY SLVR 5CC 16FR (CATHETERS) ×1 IMPLANT
CATH FOLEY LATEX FREE 16FR (CATHETERS) ×2 IMPLANT
COVER BACK TABLE 60X90IN (DRAPES) ×3 IMPLANT
COVER TIP SHEARS 8 DVNC (MISCELLANEOUS) ×1 IMPLANT
COVER TIP SHEARS 8MM DA VINCI (MISCELLANEOUS) ×2
DECANTER SPIKE VIAL GLASS SM (MISCELLANEOUS) ×2 IMPLANT
DEFOGGER SCOPE WARMER CLEARIFY (MISCELLANEOUS) ×3 IMPLANT
DERMABOND ADVANCED (GAUZE/BANDAGES/DRESSINGS) ×2
DERMABOND ADVANCED .7 DNX12 (GAUZE/BANDAGES/DRESSINGS) ×1 IMPLANT
DRAPE ARM DVNC X/XI (DISPOSABLE) ×3 IMPLANT
DRAPE COLUMN DVNC XI (DISPOSABLE) ×1 IMPLANT
DRAPE DA VINCI XI ARM (DISPOSABLE) ×6
DRAPE DA VINCI XI COLUMN (DISPOSABLE) ×2
DURAPREP 26ML APPLICATOR (WOUND CARE) ×3 IMPLANT
ELECT REM PT RETURN 15FT ADLT (MISCELLANEOUS) ×3 IMPLANT
GLOVE BIO SURGEON STRL SZ7 (GLOVE) ×3 IMPLANT
GLOVE BIOGEL PI IND STRL 7.0 (GLOVE) ×2 IMPLANT
GLOVE BIOGEL PI INDICATOR 7.0 (GLOVE) ×4
IRRIG SUCT STRYKERFLOW 2 WTIP (MISCELLANEOUS) ×3
IRRIGATION SUCT STRKRFLW 2 WTP (MISCELLANEOUS) ×1 IMPLANT
LEGGING LITHOTOMY PAIR STRL (DRAPES) ×2 IMPLANT
MANIFOLD NEPTUNE II (INSTRUMENTS) ×2 IMPLANT
MANIPULATOR ADVINCU DEL 2.5 PL (MISCELLANEOUS) IMPLANT
MANIPULATOR ADVINCU DEL 3.0 PL (MISCELLANEOUS) IMPLANT
MANIPULATOR ADVINCU DEL 3.5 PL (MISCELLANEOUS) ×2 IMPLANT
MANIPULATOR ADVINCU DEL 4.0 PL (MISCELLANEOUS) IMPLANT
NEEDLE INSUFFLATION 120MM (ENDOMECHANICALS) ×3 IMPLANT
OBTURATOR OPTICAL STANDARD 8MM (TROCAR) ×2
OBTURATOR OPTICAL STND 8 DVNC (TROCAR) ×1
OBTURATOR OPTICALSTD 8 DVNC (TROCAR) ×1 IMPLANT
PACK ROBOT WH (CUSTOM PROCEDURE TRAY) ×3 IMPLANT
PACK ROBOTIC GOWN (GOWN DISPOSABLE) ×3 IMPLANT
PACK TRENDGUARD 450 HYBRID PRO (MISCELLANEOUS) IMPLANT
PAD PREP 24X48 CUFFED NSTRL (MISCELLANEOUS) ×3 IMPLANT
POSITIONER SURGICAL ARM (MISCELLANEOUS) ×6 IMPLANT
SEAL CANN UNIV 5-8 DVNC XI (MISCELLANEOUS) ×3 IMPLANT
SEAL XI 5MM-8MM UNIVERSAL (MISCELLANEOUS) ×6
SET CYSTO W/LG BORE CLAMP LF (SET/KITS/TRAYS/PACK) ×3 IMPLANT
SET TRI-LUMEN FLTR TB AIRSEAL (TUBING) ×3 IMPLANT
SUT DVC VLOC 180 0 12IN GS21 (SUTURE)
SUT VIC AB 2-0 CT2 27 (SUTURE) ×6 IMPLANT
SUT VIC AB 2-0 UR6 27 (SUTURE) IMPLANT
SUT VICRYL RAPIDE 3 0 (SUTURE) ×6 IMPLANT
SUT VLOC 180 0 9IN  GS21 (SUTURE) ×2
SUT VLOC 180 0 9IN GS21 (SUTURE) ×1 IMPLANT
SUTURE DVC VLC 180 0 12IN GS21 (SUTURE) IMPLANT
TOWEL OR 17X26 10 PK STRL BLUE (TOWEL DISPOSABLE) ×3 IMPLANT
TRENDGUARD 450 HYBRID PRO PACK (MISCELLANEOUS) ×3
TROCAR PORT AIRSEAL 5X120 (TROCAR) ×1 IMPLANT
TROCAR PORT AIRSEAL 8X100 (TROCAR) ×2 IMPLANT
WATER STERILE IRR 1000ML POUR (IV SOLUTION) ×3 IMPLANT

## 2018-01-14 NOTE — Progress Notes (Signed)
There has been no change in the patients history, status or exam since the history and physical.  Vitals:   01/14/18 0545  BP: 120/85  Pulse: 80  Resp: 18  Temp: 98 F (36.7 C)  TempSrc: Oral  SpO2: 100%  Weight: 69.9 kg  Height: 5\' 6"  (1.676 m)    No results found for this or any previous visit (from the past 72 hour(s)).  Ubah Radke A

## 2018-01-14 NOTE — Anesthesia Preprocedure Evaluation (Addendum)
Anesthesia Evaluation  Patient identified by MRN, date of birth, ID band Patient awake    Reviewed: Allergy & Precautions, NPO status , Patient's Chart, lab work & pertinent test results  History of Anesthesia Complications (+) PONV and history of anesthetic complications  Airway Mallampati: I  TM Distance: >3 FB Neck ROM: Full    Dental  (+) Teeth Intact, Dental Advisory Given   Pulmonary neg pulmonary ROS,    Pulmonary exam normal breath sounds clear to auscultation       Cardiovascular Exercise Tolerance: Good negative cardio ROS Normal cardiovascular exam Rhythm:Regular Rate:Normal     Neuro/Psych negative neurological ROS  negative psych ROS   GI/Hepatic negative GI ROS, Neg liver ROS,   Endo/Other  negative endocrine ROS  Renal/GU negative Renal ROS     Musculoskeletal negative musculoskeletal ROS (+)   Abdominal   Peds  Hematology negative hematology ROS (+)   Anesthesia Other Findings Day of surgery medications reviewed with the patient.  Reproductive/Obstetrics ENDOMETRIOSIS                            Anesthesia Physical Anesthesia Plan  ASA: I  Anesthesia Plan: General   Post-op Pain Management:    Induction: Intravenous  PONV Risk Score and Plan: 4 or greater and Diphenhydramine, Scopolamine patch - Pre-op, Midazolam, TIVA, Dexamethasone and Ondansetron  Airway Management Planned: Oral ETT  Additional Equipment:   Intra-op Plan:   Post-operative Plan: Extubation in OR  Informed Consent: I have reviewed the patients History and Physical, chart, labs and discussed the procedure including the risks, benefits and alternatives for the proposed anesthesia with the patient or authorized representative who has indicated his/her understanding and acceptance.   Dental advisory given  Plan Discussed with: CRNA  Anesthesia Plan Comments: (History of PONV. Will plan for  TIVA. 2nd IV after induction.)        Anesthesia Quick Evaluation

## 2018-01-14 NOTE — Op Note (Signed)
01/14/2018  9:16 AM  PATIENT:  Eric Form  52 y.o. female  PRE-OPERATIVE DIAGNOSIS:  Adenomyosis, possible ENDOMETRIOSIS, menorrhagia  POST-OPERATIVE DIAGNOSIS:  Adenomyosis, menorrhagia  PROCEDURE:  Procedure(s): XI ROBOTIC ASSISTED LAPAROSCOPIC HYSTERECTOMY AND BILATERAL SALPINGECTOMY (Bilateral) XI ROBOTIC ASSISTED BILATERAL SALPINGECTOMY (Bilateral) CYSTOSCOPY (Bilateral)  SURGEON:  Surgeon(s) and Role:    * Bobbye Charleston, MD - Primary    * Jerelyn Charles, MD - Assisting  ANESTHESIA:   general  EBL:  100cc  LOCAL MEDICATIONS USED:  OTHER ropivicaine  SPECIMEN:  Source of Specimen:  uterus, cervix, tubes   DISPOSITION OF SPECIMEN:  PATHOLOGY  COUNTS:  YES  TOURNIQUET:  * No tourniquets in log *  DICTATION: .Note written in EPIC  PLAN OF CARE: Admit for overnight observation  PATIENT DISPOSITION:  PACU - hemodynamically stable.   Delay start of Pharmacological VTE agent (>24hrs) due to surgical blood loss or risk of bleeding: not applicable  Complications:  None.  Findings:  8 weeks size uterus.  Ovaries were normal.  There were some adhesions of the L tube to the L ovary and some adhesions of the colon to anterior abdominal wall. The ureters were identified during multiple points of the case and were always out of the field of dissection.  On cystoscopy, the bladder was intact and bilateral spill was seen from each ureteral orriface.    Medications:  Ancef.  Ropivicaine.      Technique:   After adequate anesthesia was achieved the patient was positioned, prepped and draped in usual sterile fashion.  A speculum was placed in the vagina and the cervix dilated with pratt dilators.  The 3 cm Koh ring Advincula was assembled and placed in proper fashion.  The  Speculum was removed and the bladder catheterized with a foley.     Attention was turned to the abdomen where a 1 cm incision was made 1 cm above the umbilicus.  The veress needle was introduced without  aspiration of bowel contents or blood and the abdomen insufflated. The 8.5 mm Robotic trocar was placed and the other three trocar sites were marked out, all approximately 10 cm from each other and the camera.  Two 8.5 mm trocars were placed on either side of the camera port and a 5 mm assistant port was placed 3 cm above the line of the other trocars.  All trocars were inserted under direct visualization of the camera.  The patient was placed in trendelenburg and then the Robot docked.  The fenestrated bipolar were placed on arm 1 and the Hot shears on arm 3 and introduced under direct visualization of the camera.   I then broke scrub and sat down at the console.  The above findings were noted and the ureters identified well out of the field of dissection.  The right fallopian tube was isolated and cauterized with the bipolar.  The Utero-ovarian ligament was then divided with the bipolar cautery and shears.  The posterior broad ligament was then divided with the hot shears until the uterosacral ligament.  The Broad and cardinal ligaments were then cauterized against the cervix to the level of the Koh ring, securing the uterine artery.  Each pedicle was then incised with the shears.  The anterior leaf was then incised at the reflection of the vessico-uterine junction and the lateral bladder retracted inferiorly after the round ligament had been divided with the bipolar forceps.  The left tube was cauterized with the bipolar and divided with the shears;  then the left utero-ovarian ligament divided with the bipolar forceps and the scissors.  The round ligament was divided as well and the posterior leaf of the broad ligament then divided with the hot shears. The broad and cardinal ligaments were then cauterized on the left in the same way.   At the level of the internal os, the uterine arteries were bilaterally cauterized with the bipolar.  The ureters were identified well out of the field of dissection.     The  bladder was then able to be retracted inferiorly and the vesico-uterine fascia was incised in the midline until the bladder was removed one cm below the Koh ring.  The hot shears then circumferentially incised the vagina at the level of the reflection on the [facility] ring.  Once the uterus and cervix were amputated, cautery was used to insure hemostasis of the cuff.  Once hemostasis was achieved, the scissors were changed to the mega suture cut needle driver and the cuff was closed with a running stitches of 0-vicryl V loc.  Cautery was used to ensure hemostasis of the left pedicles very superficially. The ureters were peristalsing bilaterally well and very lateral to the areas of operation.     The Robot was then undocked and I scrubbed back in.  The needle was removed and Ropivicaine was introduced into the pelvis. The skin incisions were closed with subcuticular stitches of 3-0 vicryl Rapide and Dermabond.  All instruments were removed from the vagina and cystoscopy performed, revealing an intact bladder and vigourous spill of urine from each ureteral orifice.  The cystoscope was removed and the patient taken to the recovery room in stable condition.   Dietra Stokely A

## 2018-01-14 NOTE — Discharge Instructions (Signed)

## 2018-01-14 NOTE — Discharge Summary (Signed)
Physician Discharge Summary  Patient ID: Breanna Gates MRN: [id number] DOB/AGE: 11/13/1965 52 y.o.  Admit date: 01/14/2018 Discharge date: 01/14/2018  Admission Diagnoses:menorrhagia, DUB, adenomyosis  Discharge Diagnoses: same Active Problems:   * No active hospital problems. *   Discharged Condition: good  Hospital Course: Uncomplicated robotic TLH, salpingectomies and cysto  Consults: None  Significant Diagnostic Studies: none  Treatments: surgery:  Uncomplicated robotic TLH, salpingectomies and cysto  Discharge Exam: Blood pressure 120/85, pulse 80, temperature 98 F (36.7 C), temperature source Oral, resp. rate 18, height 5\' 6"  (1.676 m), weight 69.9 kg, SpO2 100 %.   Disposition:   Discharge Instructions    Call MD for:  temperature >100.4   Complete by:  As directed    Diet - low sodium heart healthy   Complete by:  As directed    Discharge instructions   Complete by:  As directed    No driving on narcotics, no sexual activity for 2 weeks.   Increase activity slowly   Complete by:  As directed    May shower / Bathe   Complete by:  As directed    Shower, no bath for 2 weeks.   Remove dressing in 24 hours   Complete by:  As directed    Sexual Activity Restrictions   Complete by:  As directed    No sexual activity for 2 weeks.     Allergies as of 01/14/2018      Reactions   Latex    Internally causes irritation    Other Nausea And Vomiting   general anesthesia      Medication List    STOP taking these medications   naproxen sodium 220 MG tablet Commonly known as:  ALEVE     TAKE these medications   oxyCODONE-acetaminophen 5-325 MG tablet Commonly known as:  PERCOCET/ROXICET Take 1 tablet by mouth every 4 (four) hours as needed for severe pain.      Follow-up Information    Bobbye Charleston, MD Follow up in 2 week(s).   Specialty:  Obstetrics and Gynecology Contact information: [address] [place]  [id number] [phone]           Signed: Velvie Thomaston A 01/14/2018, 9:30 AM

## 2018-01-14 NOTE — Anesthesia Procedure Notes (Signed)
Procedure Name: Intubation Date/Time: 01/14/2018 7:32 AM Performed by: Catalina Gravel, MD Pre-anesthesia Checklist: Patient identified, Emergency Drugs available, Suction available and Patient being monitored Patient Re-evaluated:Patient Re-evaluated prior to induction Oxygen Delivery Method: Circle system utilized Preoxygenation: Pre-oxygenation with 100% oxygen Induction Type: IV induction Ventilation: Mask ventilation without difficulty Laryngoscope Size: Mac and 3 Grade View: Grade II Tube type: Oral Tube size: 7.0 mm Number of attempts: 1 Airway Equipment and Method: Stylet and Oral airway Placement Confirmation: ETT inserted through vocal cords under direct vision,  positive ETCO2 and breath sounds checked- equal and bilateral Secured at: 21 cm Tube secured with: Tape Dental Injury: Teeth and Oropharynx as per pre-operative assessment

## 2018-01-14 NOTE — Brief Op Note (Signed)
01/14/2018  9:16 AM  PATIENT:  Breanna Gates  52 y.o. female  PRE-OPERATIVE DIAGNOSIS:  Adenomyosis, possible ENDOMETRIOSIS, menorrhagia  POST-OPERATIVE DIAGNOSIS:  Adenomyosis, menorrhagia  PROCEDURE:  Procedure(s): XI ROBOTIC ASSISTED LAPAROSCOPIC HYSTERECTOMY AND BILATERAL SALPINGECTOMY (Bilateral) XI ROBOTIC ASSISTED BILATERAL SALPINGECTOMY (Bilateral) CYSTOSCOPY (Bilateral)  SURGEON:  Surgeon(s) and Role:    * Bobbye Charleston, MD - Primary    * Jerelyn Charles, MD - Assisting  ANESTHESIA:   general  EBL:  100cc  LOCAL MEDICATIONS USED:  OTHER ropivicaine  SPECIMEN:  Source of Specimen:  uterus, cervix, tubes   DISPOSITION OF SPECIMEN:  PATHOLOGY  COUNTS:  YES  TOURNIQUET:  * No tourniquets in log *  DICTATION: .Note written in EPIC  PLAN OF CARE: Admit for overnight observation  PATIENT DISPOSITION:  PACU - hemodynamically stable.   Delay start of Pharmacological VTE agent (>24hrs) due to surgical blood loss or risk of bleeding: not applicable

## 2018-01-14 NOTE — Transfer of Care (Signed)
  Last Vitals:  Vitals Value Taken Time  BP 110/60 01/14/2018  9:30 AM  Temp 36.3 C 01/14/2018  9:30 AM  Pulse 59 01/14/2018  9:35 AM  Resp 10 01/14/2018  9:35 AM  SpO2 100 % 01/14/2018  9:35 AM  Vitals shown include unvalidated device data.  Last Pain:  Vitals:   01/14/18 0616  TempSrc:   PainSc: 0-No pain         Immediate Anesthesia Transfer of Care Note  Patient: Breanna Gates  Procedure(s) Performed: Procedure(s) (LRB): XI ROBOTIC ASSISTED LAPAROSCOPIC HYSTERECTOMY AND BILATERAL SALPINGECTOMY (Bilateral) XI ROBOTIC ASSISTED BILATERAL SALPINGECTOMY (Bilateral) CYSTOSCOPY (Bilateral)  Patient Location: PACU  Anesthesia Type: General  Level of Consciousness: sleepy  Airway & Oxygen Therapy: Patient Spontanous Breathing and Patient connected to face mask oxygen, oral airway remaining  Post-op Assessment: Report given to PACU RN and Post -op Vital signs reviewed and stable  Post vital signs: Reviewed and stable  Complications: No apparent anesthesia complications

## 2018-01-15 ENCOUNTER — Encounter (HOSPITAL_COMMUNITY): Payer: Self-pay | Admitting: Obstetrics and Gynecology

## 2018-01-15 NOTE — Anesthesia Postprocedure Evaluation (Signed)
Anesthesia Post Note  Patient: Breanna Gates  Procedure(s) Performed: XI ROBOTIC ASSISTED LAPAROSCOPIC HYSTERECTOMY AND BILATERAL SALPINGECTOMY (Bilateral ) XI ROBOTIC ASSISTED BILATERAL SALPINGECTOMY (Bilateral ) CYSTOSCOPY (Bilateral )     Patient location during evaluation: PACU Anesthesia Type: General Level of consciousness: awake and alert, awake and oriented Pain management: pain level controlled Vital Signs Assessment: post-procedure vital signs reviewed and stable Respiratory status: spontaneous breathing, nonlabored ventilation and respiratory function stable Cardiovascular status: blood pressure returned to baseline and stable Postop Assessment: no apparent nausea or vomiting Anesthetic complications: no    Last Vitals:  Vitals:   01/14/18 1400 01/14/18 1551  BP: 115/72 129/81  Pulse: 70 73  Resp: 16 16  Temp: 36.9 C 36.7 C  SpO2: 98% 100%    Last Pain:  Vitals:   01/14/18 1551  TempSrc:   PainSc: 0-No pain                 Catalina Gravel

## 2018-11-26 IMAGING — MG DIGITAL DIAGNOSTIC UNILATERAL RIGHT MAMMOGRAM WITH TOMO AND CAD
4 series · 4 of 12 positions shown · non-contrast
Comparison: Previous exam(s).

CLINICAL DATA: Screening recall for a possible architectural
distortion in the right breast.

EXAM:
DIGITAL DIAGNOSTIC UNILATERAL RIGHT MAMMOGRAM WITH CAD AND TOMO

[R CC synth-2D]
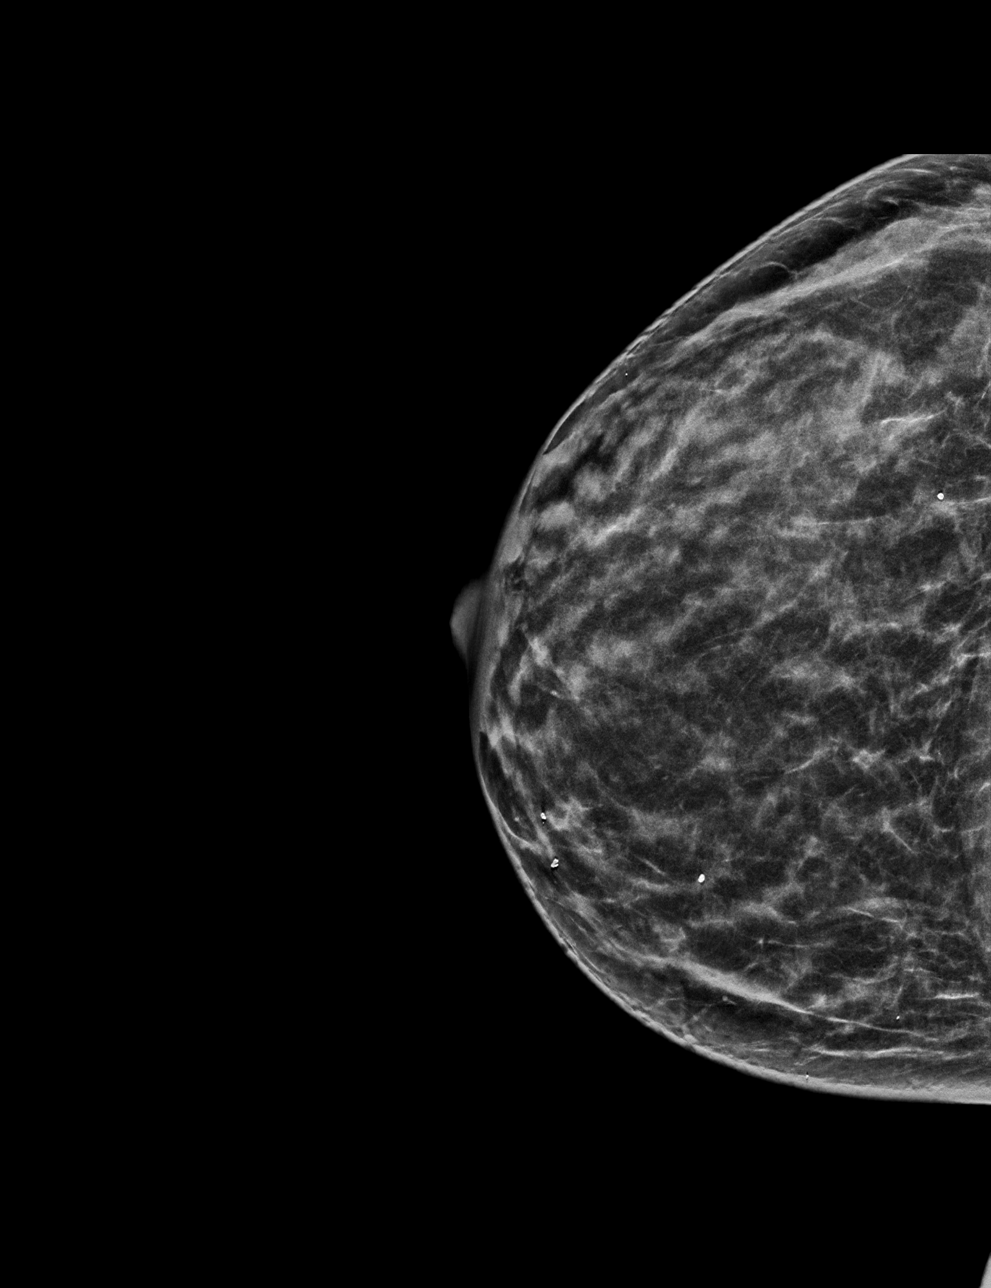

[R MLO synth-2D]
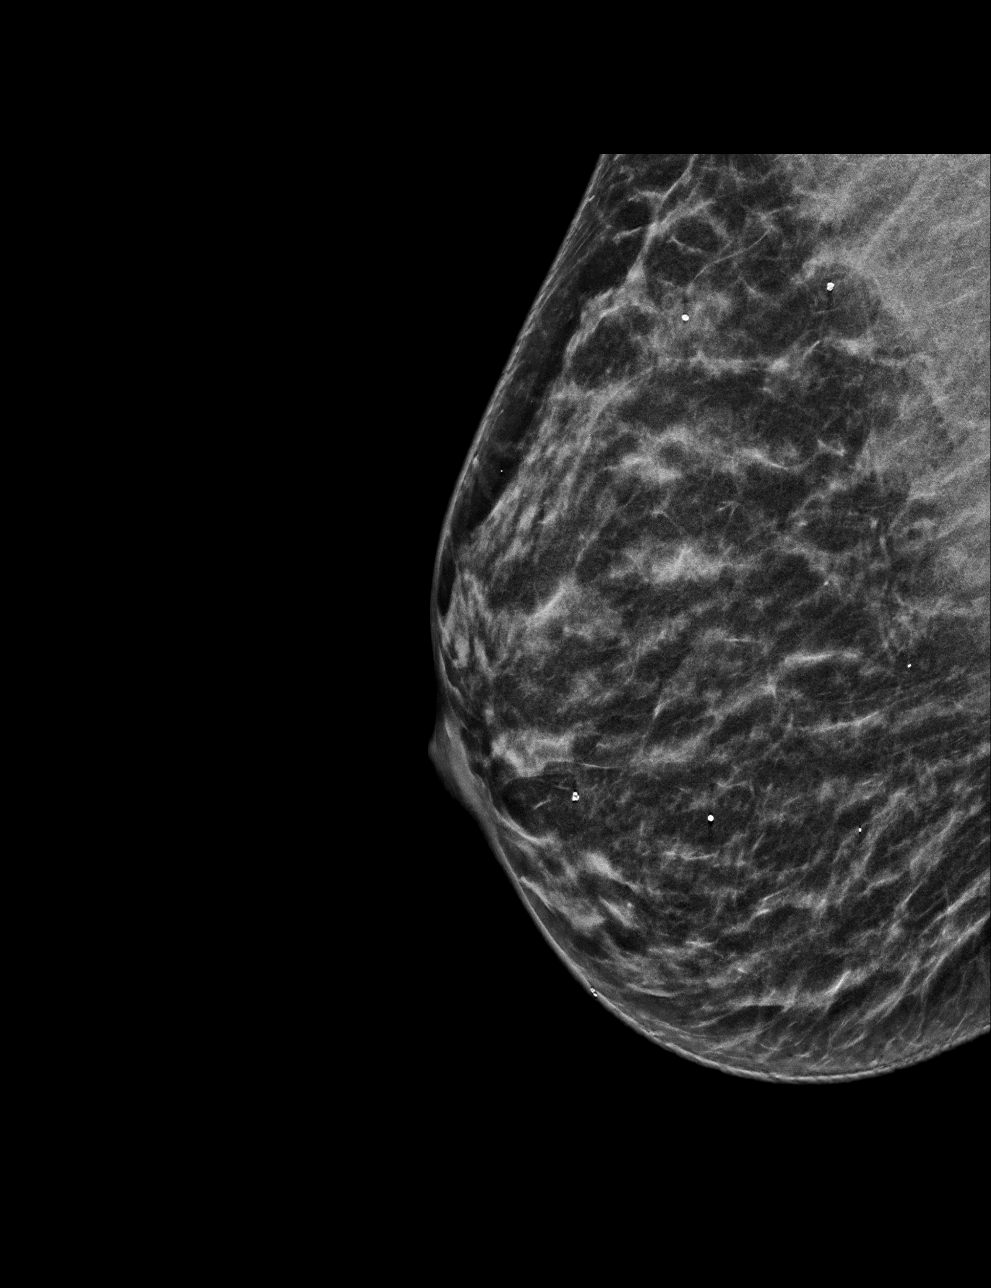

[R MLO tomo · tomo slice 25/48.0]
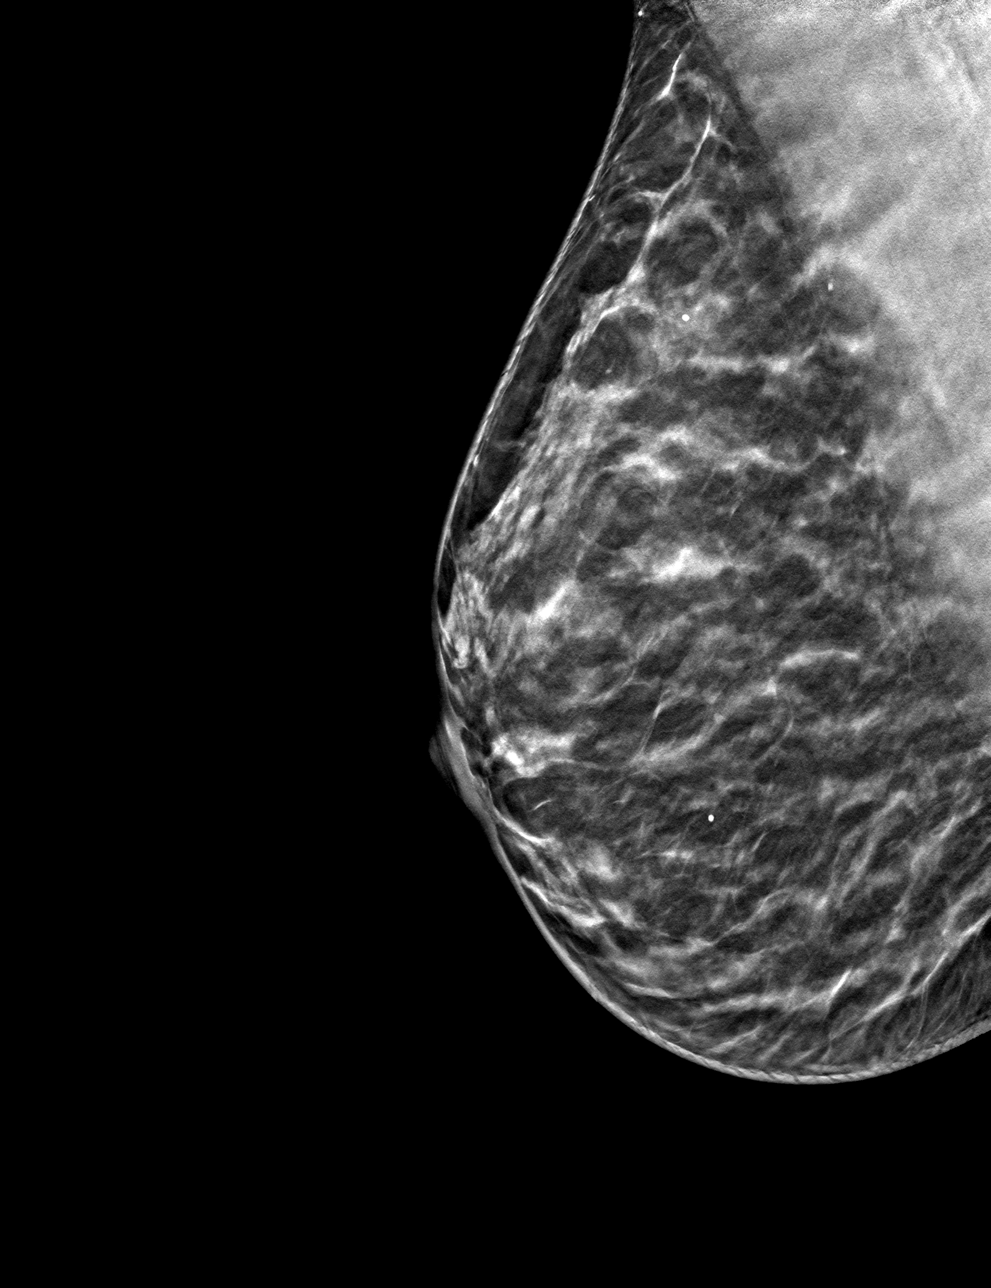

[R CC tomo · tomo slice 25/50.0]
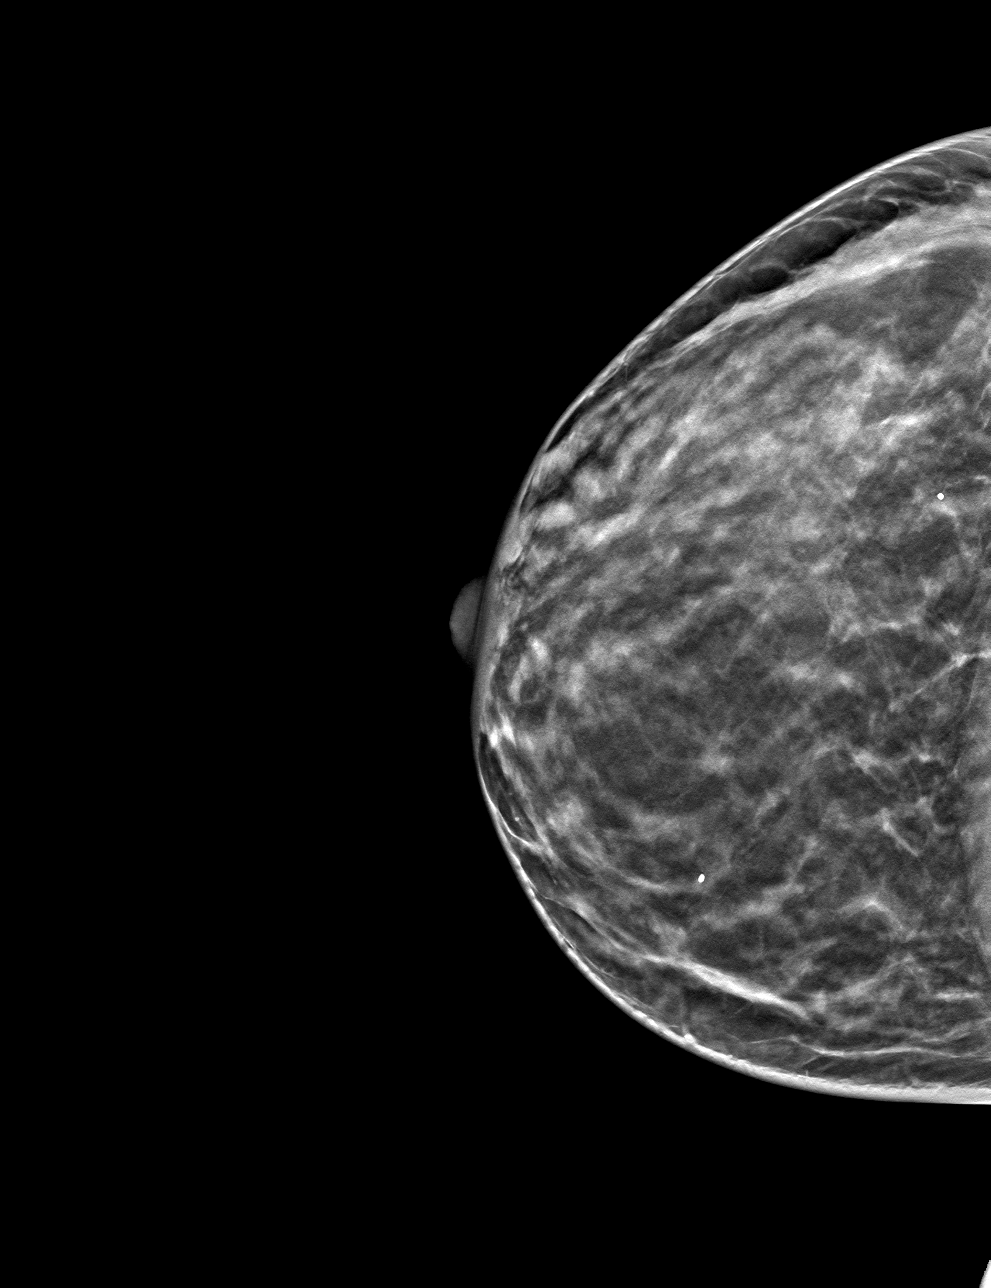

[4 of 12 positions shown; findings below may reference images not displayed]

ACR Breast Density Category c: The breast tissue is heterogeneously
dense, which may obscure small masses.
FINDINGS: The possible distortion, which was noted in the posterior right
breast on the MLO view only, is due to superimposed fibroglandular
tissue on the diagnostic 3D images. There is no true distortion.
There are no discrete masses and no suspicious calcifications.

Mammographic images were processed with CAD.
IMPRESSION: No evidence of malignancy.

RECOMMENDATION:
Screening mammogram in one year.(Code:7Y-1-T0U)

I have discussed the findings and recommendations with the patient.
Results were also provided in writing at the conclusion of the
visit. If applicable, a reminder letter will be sent to the patient
regarding the next appointment.

BI-RADS CATEGORY  1: Negative.

## 2022-12-18 ENCOUNTER — Other Ambulatory Visit: Payer: Self-pay | Admitting: Family Medicine

## 2022-12-18 DIAGNOSIS — Z1231 Encounter for screening mammogram for malignant neoplasm of breast: Secondary | ICD-10-CM
